# Patient Record
Sex: Male | Born: 1995 | Race: White | Hispanic: No | Marital: Single | Smoking: Never smoker
Health system: Southern US, Community
[De-identification: ages and names within clinical notes are randomized; demographics above are authoritative.]

## PROBLEM LIST (undated history)

## (undated) DIAGNOSIS — T7840XA Allergy, unspecified, initial encounter: Secondary | ICD-10-CM

## (undated) DIAGNOSIS — K5090 Crohn's disease, unspecified, without complications: Secondary | ICD-10-CM

## (undated) HISTORY — DX: Crohn's disease, unspecified, without complications: K50.90

## (undated) HISTORY — DX: Allergy, unspecified, initial encounter: T78.40XA

---
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
## 2014-12-27 ENCOUNTER — Ambulatory Visit (INDEPENDENT_AMBULATORY_CARE_PROVIDER_SITE_OTHER): Payer: Commercial Managed Care - PPO | Admitting: Medical

## 2014-12-27 ENCOUNTER — Encounter: Payer: Self-pay | Admitting: Medical

## 2014-12-27 VITALS — BP 133/67 | HR 88 | Temp 97.9°F | Ht 70.75 in | Wt 143.6 lb

## 2014-12-27 DIAGNOSIS — H7292 Unspecified perforation of tympanic membrane, left ear: Secondary | ICD-10-CM | POA: Diagnosis not present

## 2014-12-27 DIAGNOSIS — H7290 Unspecified perforation of tympanic membrane, unspecified ear: Secondary | ICD-10-CM | POA: Insufficient documentation

## 2014-12-27 DIAGNOSIS — K5010 Crohn's disease of large intestine without complications: Secondary | ICD-10-CM

## 2014-12-27 MED ORDER — AMOXICILLIN-POT CLAVULANATE 875-125 MG PO TABS: 1.0000 | ORAL_TABLET | Freq: Two times a day (BID) | ORAL | Status: DC

## 2014-12-27 NOTE — Assessment & Plan Note (Addendum)
Refer to Dr. Loreta Ave. Doing well. Refill his med for one mont until can see Gi. He will make sure he gave [place] correct dose then I will send in rx.

## 2014-12-27 NOTE — Progress Notes (Signed)
Subjective:    Patient ID: Bradley Adkins, male    DOB: February 17, 1996, 19 y.o.   MRN: [id number]  HPI   I have reviewed pt PMH, PSH, FH, Social History and Surgical History  Allergies- hx of and mild seasonal allergies.  Ear pain since 1 month. Friend slapped his ear. He had a lot of ringing and decreased hearing. Then gradual ringing decrease and hearing got better. When he worked out felt like when breaths heavily could feel air go through the membrane. Then the other swimming and mild deep submersion around 4 ft caused extreme pain.  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 Pt supposed to see Gi but has not seen recently. Dx in 2009.      Review of Systems  Constitutional: Negative for fever, chills, diaphoresis, activity change and fatigue.  HENT: Positive for ear pain.   Respiratory: Negative for cough, chest tightness and shortness of breath.   Cardiovascular: Negative for chest pain, palpitations and leg swelling.  Gastrointestinal: Negative for nausea, vomiting and abdominal pain.  Musculoskeletal: Negative for neck pain and neck stiffness.  Neurological: Negative for dizziness, tremors, seizures, syncope, facial asymmetry, speech difficulty, weakness, light-headedness, numbness and headaches.  Psychiatric/Behavioral: Negative for behavioral problems, confusion and agitation. The patient is not nervous/anxious.     Past Medical History  Diagnosis Date  . Crohn disease   . Allergy     History   Social History  . Marital Status: Single    Spouse Name: N/A  . Number of Children: N/A  . Years of Education: N/A   Occupational History  . Not on file.   Social History Main Topics  . Smoking status: Never Smoker   . Smokeless tobacco: Never Used  . Alcohol Use: No  . Drug Use: No  . Sexual Activity: Not on file   Other Topics Concern  . Not on file   Social History Narrative  . No narrative on file    No past surgical history on file.  No family history  on file.  Allergies  Allergen Reactions  . Shellfish Allergy Anaphylaxis    No current outpatient prescriptions on file prior to visit.   No current facility-administered medications on file prior to visit.    BP 133/67 mmHg  Pulse 88  Temp(Src) 97.9 F (36.6 C) (Oral)  Ht 5' 10.75" (1.797 m)  Wt 143 lb 9.6 oz (65.137 kg)  BMI 20.17 kg/m2  SpO2 100%       Objective:   Physical Exam   General  Mental Status - Alert. General Appearance - Well groomed. Not in acute distress.  Skin Rashes- No Rashes.  HEENT Head- Normal. Ear Auditory Canal - Left- Normal. Right - Normal.Tympanic Membrane- Left- red tm with small perforation in lower porl. Right- Normal. Eye Sclera/Conjunctiva- Left- Normal. Right- Normal. Nose & Sinuses Nasal Mucosa- Left-  Boggy and Congested. Right- not  Boggy and  Not Congested.Bilateral no  maxillary and no frontal sinus pressure. Mouth & Throat Lips: Upper Lip- Normal: no dryness, cracking, pallor, cyanosis, or vesicular eruption. Lower Lip-Normal: no dryness, cracking, pallor, cyanosis or vesicular eruption. Buccal Mucosa- Bilateral- No Aphthous ulcers. Oropharynx- No Discharge or Erythema. Tonsils: Characteristics- Bilateral- No Erythema or Congestion. Size/Enlargement- Bilateral- No enlargement. Discharge- bilateral-None.  Neck Neck- Supple. No Masses.   Chest and Lung Exam Auscultation: Breath Sounds:-Clear even and unlabored.  Cardiovascular Auscultation:Rythm- Regular, rate and rhythm. Murmurs & Other Heart Sounds:Ausculatation of the heart reveal- No Murmurs.  Lymphatic Head &  Neck General Head & Neck Lymphatics: Bilateral: Description- No Localized lymphadenopathy.      Assessment & Plan:

## 2014-12-27 NOTE — Patient Instructions (Addendum)
Perforated eardrum By exam perforated  and looks like it is healing. Otitis media as well. Refer to ent as well. Avoid trauma and no swimming.    Refer to Dr. Loreta Ave for crohns.  Follow up in 3 wks or as needed.

## 2014-12-27 NOTE — Assessment & Plan Note (Addendum)
By exam perforated  and looks like it is healing. Otitis media as well. Refer to ent as well. Avoid trauma and no swimming.

## 2015-02-27 ENCOUNTER — Ambulatory Visit (INDEPENDENT_AMBULATORY_CARE_PROVIDER_SITE_OTHER): Payer: Commercial Managed Care - PPO | Admitting: Medical

## 2015-02-27 ENCOUNTER — Encounter: Payer: Self-pay | Admitting: Medical

## 2015-02-27 VITALS — BP 125/55 | HR 67 | Temp 98.2°F | Ht 70.75 in | Wt 144.0 lb

## 2015-02-27 DIAGNOSIS — K5010 Crohn's disease of large intestine without complications: Secondary | ICD-10-CM

## 2015-02-27 MED ORDER — MESALAMINE 1.2 G PO TBEC: 1.2000 g | DELAYED_RELEASE_TABLET | Freq: Two times a day (BID) | ORAL | Status: DC

## 2015-02-27 NOTE — Assessment & Plan Note (Signed)
Stable symptomatically but not seen by GI for years. So will do some labs at pt request and ask him to do stool cards for blood and turn in. But also refer to Gi for evaluation. At least 5 years since saw pediatric Gi. No 19 yr old. Continue lialda.

## 2015-02-27 NOTE — Patient Instructions (Signed)
Crohn's colitis Stable symptomatically but not seen by GI for years. So will do some labs at pt request and ask him to do stool cards for blood and turn in. But also refer to Gi for evaluation. At least 5 years since saw pediatric Gi. No 19 yr old. Continue lialda.    Follow up in 2 months or as needed

## 2015-02-27 NOTE — Progress Notes (Signed)
   Subjective:    Patient ID: Bradley Adkins, male    DOB: 1996/03/04, 19 y.o.   MRN: [id number]  HPI  Pt is here for Crohns disease. He wants blood work. Pt has medication. He states he takes Lialda on average one time a day. Rather than twice a day. Pt denies any diarrhea. No fever, no chills or sweats. No significant abdominal pain that he can remember.  Pt states last time saw his specialist was a while ago. Chart indicate last saw GI in 2010.   Review of Systems  Constitutional: Negative for fever, chills, diaphoresis, activity change and fatigue.  Respiratory: Negative for cough, chest tightness and shortness of breath.   Cardiovascular: Negative for chest pain, palpitations and leg swelling.  Gastrointestinal: Negative for nausea, vomiting and abdominal pain.  Musculoskeletal: Negative for neck pain and neck stiffness.  Neurological: Negative for dizziness, syncope, weakness and numbness.  Psychiatric/Behavioral: Negative for behavioral problems, confusion and agitation. The patient is not nervous/anxious.      Past Medical History  Diagnosis Date  . Crohn disease   . Allergy     History   Social History  . Marital Status: Single    Spouse Name: N/A  . Number of Children: N/A  . Years of Education: N/A   Occupational History  . Not on file.   Social History Main Topics  . Smoking status: Never Smoker   . Smokeless tobacco: Never Used  . Alcohol Use: No  . Drug Use: No  . Sexual Activity: Not on file   Other Topics Concern  . Not on file   Social History Narrative    No past surgical history on file.  No family history on file.  Allergies  Allergen Reactions  . Shellfish Allergy Anaphylaxis    Current Outpatient Prescriptions on File Prior to Visit  Medication Sig Dispense Refill  . mesalamine (LIALDA) 1.2 G EC tablet Take 1.2 g by mouth 2 (two) times daily. Chrohns Disease     No current facility-administered medications on file prior to visit.     BP 125/55 mmHg  Pulse 67  Temp(Src) 98.2 F (36.8 C) (Oral)  Ht 5' 10.75" (1.797 m)  Wt 144 lb (65.318 kg)  BMI 20.23 kg/m2  SpO2 99%       Objective:   Physical Exam  General Appearance- Not in acute distress.  HEENT Eyes- Scleraeral/Conjuntiva-bilat- Not Yellow. Mouth & Throat- Normal.  Chest and Lung Exam Auscultation: Breath sounds:-Normal. Adventitious sounds:- No Adventitious sounds.  Cardiovascular Auscultation:Rythm - Regular. Heart Sounds -Normal heart sounds.  Abdomen Inspection:-Inspection Normal.  Palpation/Perucssion: Palpation and Percussion of the abdomen reveal- Non Tender, No Rebound tenderness, No rigidity(Guarding) and No Palpable abdominal masses.  Liver:-Normal.  Spleen:- Normal.         Assessment & Plan:

## 2015-02-28 LAB — CBC WITH DIFFERENTIAL/PLATELET
BASOS ABS: 0 10*3/uL (ref 0.0–0.1)
BASOS PCT: 0.5 % (ref 0.0–3.0)
EOS ABS: 0.2 10*3/uL (ref 0.0–0.7)
Eosinophils Relative: 2.5 % (ref 0.0–5.0)
HEMATOCRIT: 44.2 % (ref 39.0–52.0)
HEMOGLOBIN: 14.9 g/dL (ref 13.0–17.0)
LYMPHS ABS: 1.4 10*3/uL (ref 0.7–4.0)
Lymphocytes Relative: 21.4 % (ref 12.0–46.0)
MCHC: 33.9 g/dL (ref 30.0–36.0)
MCV: 88.8 fl (ref 78.0–100.0)
MONO ABS: 0.5 10*3/uL (ref 0.1–1.0)
Monocytes Relative: 7.7 % (ref 3.0–12.0)
NEUTROS ABS: 4.3 10*3/uL (ref 1.4–7.7)
Neutrophils Relative %: 67.9 % (ref 43.0–77.0)
PLATELETS: 221 10*3/uL (ref 150.0–400.0)
RBC: 4.97 Mil/uL (ref 4.22–5.81)
RDW: 12.9 % (ref 11.5–14.6)
WBC: 6.4 10*3/uL (ref 4.5–10.5)

## 2015-02-28 LAB — COMPREHENSIVE METABOLIC PANEL
ALK PHOS: 75 U/L (ref 39–117)
ALT: 17 U/L (ref 0–53)
AST: 25 U/L (ref 0–37)
Albumin: 4.5 g/dL (ref 3.5–5.2)
BUN: 13 mg/dL (ref 6–23)
CALCIUM: 9.5 mg/dL (ref 8.4–10.5)
CO2: 28 mEq/L (ref 19–32)
Chloride: 106 mEq/L (ref 96–112)
Creatinine, Ser: 0.92 mg/dL (ref 0.40–1.50)
GFR: 110.8 mL/min (ref >60.00)
Glucose, Bld: 90 mg/dL (ref 70–99)
POTASSIUM: 3.8 meq/L (ref 3.5–5.1)
SODIUM: 141 meq/L (ref 135–145)
Total Bilirubin: 0.6 mg/dL (ref 0.2–1.2)
Total Protein: 7.4 g/dL (ref 6.0–8.3)

## 2015-02-28 LAB — C-REACTIVE PROTEIN: CRP: 0.2 mg/dL — AB (ref 0.5–20.0)

## 2015-02-28 LAB — SEDIMENTATION RATE: Sed Rate: 5 mm/hr (ref 0–22)

## 2015-03-28 ENCOUNTER — Encounter: Payer: Self-pay | Admitting: Medical

## 2015-07-10 ENCOUNTER — Telehealth: Payer: Self-pay | Admitting: Medical

## 2015-07-10 NOTE — Telephone Encounter (Signed)
Left message for patient to call about Flu Shot °

## 2015-07-17 ENCOUNTER — Ambulatory Visit (INDEPENDENT_AMBULATORY_CARE_PROVIDER_SITE_OTHER): Payer: Commercial Managed Care - PPO | Admitting: Physician Assistant

## 2015-07-17 ENCOUNTER — Encounter: Payer: Self-pay | Admitting: Physician Assistant

## 2015-07-17 VITALS — BP 118/80 | HR 92 | Temp 98.4°F | Ht 70.75 in | Wt 142.0 lb

## 2015-07-17 DIAGNOSIS — Z202 Contact with and (suspected) exposure to infections with a predominantly sexual mode of transmission: Secondary | ICD-10-CM

## 2015-07-17 MED ORDER — AZITHROMYCIN 250 MG PO TABS
1000.0000 mg | ORAL_TABLET | Freq: Once | ORAL | Status: DC
Start: 2015-07-17 — End: 2015-12-26

## 2015-07-17 NOTE — Assessment & Plan Note (Signed)
Will empirically treat with Azithromycin. Will obtain full STI panel. Patient asymptomatic and exam unremarkable. Safe sex practices discussed. Will treat further based on results.

## 2015-07-17 NOTE — Progress Notes (Signed)
    Patient presents to clinic today c/o exposure to chlamydia and for routine STI screening. Endorses has been sexually active off and on with one male partner. Has not used protection. Notes he was informed today that she has chlamydia and is being treated. Patient denies penile/testicual pain or swelling. Denies discharge or genital lesion. 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  Past Medical History  Diagnosis Date  . Crohn disease (HCC)   . Allergy     Current Outpatient Prescriptions on File Prior to Visit  Medication Sig Dispense Refill  . mesalamine (LIALDA) 1.2 G EC tablet Take 1 tablet (1.2 g total) by mouth 2 (two) times daily. Chrohns Disease (Patient not taking: Reported on 07/17/2015) 60 tablet 3   No current facility-administered medications on file prior to visit.    Allergies  Allergen Reactions  . Shellfish Allergy Anaphylaxis    No family history on file.  Social History   Social History  . Marital Status: Single    Spouse Name: N/A  . Number of Children: N/A  . Years of Education: N/A   Social History Main Topics  . Smoking status: Never Smoker   . Smokeless tobacco: Never Used  . Alcohol Use: No  . Drug Use: No  . Sexual Activity: Not Asked   Other Topics Concern  . None   Social History Narrative   Review of Systems - See HPI.  All other ROS are negative.  BP 118/80 mmHg  Pulse 92  Temp(Src) 98.4 F (36.9 C) (Oral)  Ht 5' 10.75" (1.797 m)  Wt 142 lb (64.411 kg)  BMI 19.95 kg/m2  SpO2 99%  Physical Exam  Constitutional: He is oriented to person, place, and time and well-developed, well-nourished, and in no distress.  HENT:  Head: Normocephalic and atraumatic.  Eyes: Conjunctivae are normal.  Neck: Neck supple.  Cardiovascular: Normal rate, regular rhythm, normal heart sounds and intact distal pulses.   Pulmonary/Chest: Effort normal and breath sounds normal.  Genitourinary: Testes/scrotum normal and penis normal. No  discharge found.  Neurological: He is alert and oriented to person, place, and time.  Skin: Skin is warm and dry. No rash noted.  Psychiatric: Affect normal.  Vitals reviewed.   No results found for this or any previous visit (from the past 2160 hour(s)).  Assessment/Plan: Exposure to chlamydia Will empirically treat with Azithromycin. Will obtain full STI panel. Patient asymptomatic and exam unremarkable. Safe sex practices discussed. Will treat further based on results.

## 2015-07-17 NOTE — Patient Instructions (Signed)
Please take the antibiotic as directed. Please avoid sexual contact until workup and treatment are complete. Always use condoms to prevent spread of infection. Limit sexual partners.  I will call you with your results.

## 2015-07-17 NOTE — Progress Notes (Signed)
Pre visit review using our clinic review tool, if applicable. No additional management support is needed unless otherwise documented below in the visit note. 

## 2015-07-18 LAB — GC/CHLAMYDIA PROBE AMP, URINE
CHLAMYDIA, SWAB/URINE, PCR: NOT DETECTED
GC PROBE AMP, URINE: NOT DETECTED

## 2015-07-18 LAB — RPR

## 2015-07-18 LAB — HIV ANTIBODY (ROUTINE TESTING W REFLEX): HIV 1&2 Ab, 4th Generation: NONREACTIVE

## 2015-07-19 ENCOUNTER — Telehealth: Payer: Self-pay | Admitting: Medical

## 2015-07-19 LAB — HSV(HERPES SMPLX)ABS-I+II(IGG+IGM)-BLD
HSV 1 GLYCOPROTEIN G AB, IGG: 9.45 IV — AB
Herpes Simplex Vrs I&II-IgM Ab (EIA): 0.46 INDEX

## 2015-07-19 MED ORDER — OSELTAMIVIR PHOSPHATE 75 MG PO CAPS: 75.0000 mg | ORAL_CAPSULE | Freq: Every day | ORAL | Status: DC

## 2015-07-19 NOTE — Telephone Encounter (Signed)
Pt mom called about her husband who had + flu at his doctor. Pt son has no symptoms. So I will rx  tamiflu prophylaxis. Also advised mom if Cin has any questions to call our office so I discuss this with him since his mom called on his behalf. I did not give any info on patient during conversation. Just took recent hx mom gave.

## 2015-07-19 NOTE — Telephone Encounter (Signed)
Pt mother called bc pt father was diagnosed with the flu yesterday at [facility]. She is wanting to be prescribed Tamiflu. Her ph# [phone]  [facility]   CC Robin bc pt mother & brother are having same issue

## 2015-07-19 NOTE — Telephone Encounter (Signed)
Edward please advise if pt will need to be seen.

## 2015-12-10 ENCOUNTER — Telehealth: Payer: Self-pay | Admitting: *Deleted

## 2015-12-10 DIAGNOSIS — K5010 Crohn's disease of large intestine without complications: Secondary | ICD-10-CM

## 2015-12-10 NOTE — Telephone Encounter (Signed)
Referral to Dr. Loreta Ave. Need done asap. Has appontment on Thursday

## 2015-12-10 NOTE — Telephone Encounter (Signed)
Bradley Adkins-- I spoke with pt's mom, she states that pt has history of Crohn's and will be going out of the country in June. Pt has scheduled an appt to see Dr Loreta Ave this Thursday and they are requesting referral from PCP.  Please advise?    Cammy Copa, CMA at 12/10/2015 2:08 PM     Status: Signed       Expand All Collapse All   Tried to return call from pt's mother. No answer. Left vm for her to give [place] a call back.            Pearline Cables, MD at 12/10/2015 1:50 PM     Status: Signed       Expand All Collapse All   [name]- I did place order for referral but did not know what issue they are concerned about. Could you call and find out? thanks            Maia Petties at 12/10/2015 11:53 AM     Status: Signed       Expand All Collapse All   Caller name: Bradley Adkins  Relationship to patient: mom Can be reached: [phone]   Reason for call: Pt was to be referred to GI after 10/24/15 visit with Dr. Patsy Lager. Mom called [facility], Dr. Charna Elizabeth at GI and was told they can fit him in this Thursday if we send referral in. Their phone# [phone].

## 2015-12-26 ENCOUNTER — Ambulatory Visit (INDEPENDENT_AMBULATORY_CARE_PROVIDER_SITE_OTHER): Payer: Commercial Managed Care - PPO | Admitting: Medical

## 2015-12-26 ENCOUNTER — Encounter: Payer: Self-pay | Admitting: Medical

## 2015-12-26 VITALS — BP 114/76 | HR 89 | Temp 98.1°F | Resp 16 | Ht 70.75 in | Wt 140.0 lb

## 2015-12-26 DIAGNOSIS — Z7189 Other specified counseling: Secondary | ICD-10-CM | POA: Diagnosis not present

## 2015-12-26 DIAGNOSIS — Z23 Encounter for immunization: Secondary | ICD-10-CM

## 2015-12-26 DIAGNOSIS — Z7185 Encounter for immunization safety counseling: Secondary | ICD-10-CM

## 2015-12-26 MED ORDER — TYPHOID VACCINE PO CPDR: 1.0000 | DELAYED_RELEASE_CAPSULE | ORAL | Status: DC

## 2015-12-26 NOTE — Progress Notes (Signed)
Pre visit review using our clinic review tool, if applicable. No additional management support is needed unless otherwise documented below in the visit note. 

## 2015-12-26 NOTE — Addendum Note (Signed)
Addended by: Neldon Labella on: 12/26/2015 10:06 AM   Modules accepted: Orders

## 2015-12-26 NOTE — Progress Notes (Signed)
   Subjective:    Patient ID: Bradley Adkins, male    DOB: June 27, 1996, 20 y.o.   MRN: [id number]  HPI  Pt is going to [place] for a couple of weeks then will go to [place], [place] and [place]. Pt will be gone for about a month.   Pt trip is June 23 rd. This is when he leaves for [place]. The June 28 or 29 will travel to other countries. Pt will be in other countries for about 3 weeks.   Pt is going to trip with his best friend.    Review of Systems  Constitutional: Negative for fever, chills and fatigue.  Respiratory: Negative for cough, choking, shortness of breath and wheezing.   Cardiovascular: Negative for chest pain and palpitations.  Gastrointestinal: Negative for abdominal pain and anal bleeding.  Musculoskeletal: Negative for myalgias, back pain and gait problem.  Skin: Negative for rash.  Neurological: Negative for dizziness and headaches.  Hematological: Negative for adenopathy. Does not bruise/bleed easily.  Psychiatric/Behavioral: Negative for behavioral problems, confusion and dysphoric mood. The patient is not nervous/anxious.     Past Medical History  Diagnosis Date  . Crohn disease (HCC)   . Allergy      Social History   Social History  . Marital Status: Single    Spouse Name: N/A  . Number of Children: N/A  . Years of Education: N/A   Occupational History  . Not on file.   Social History Main Topics  . Smoking status: Never Smoker   . Smokeless tobacco: Never Used  . Alcohol Use: No  . Drug Use: No  . Sexual Activity: Not on file   Other Topics Concern  . Not on file   Social History Narrative    No past surgical history on file.  No family history on file.  Allergies  Allergen Reactions  . Shellfish Allergy Anaphylaxis    No current outpatient prescriptions on file prior to visit.   No current facility-administered medications on file prior to visit.    BP 114/76 mmHg  Pulse 89  Temp(Src) 98.1 F (36.7 C) (Oral)  Resp  16  Ht 5' 10.75" (1.797 m)  Wt 140 lb (63.504 kg)  BMI 19.67 kg/m2  SpO2 99%       Objective:   Physical Exam  General- No acute distress. Pleasant patient. Lungs- Clear, even and unlabored. Heart- regular rate and rhythm. Neurologic- CNII- XII grossly intact.         Assessment & Plan:  For your upcoming travel will give hepatitis A vaccine.(1st in series), up to date to date on  tetanus, and rx vaccine for typhoid.   We checked out vaccine records on state data base today.  Drink only bottle water. Avoid food from street venders, and wear OFF or other type product to minimize mosquitoe bites.   Follow up as needed before or after travel.  Pt encouraged to call Health/travel clinic. Ask about japanese encephalitis vaccine since you have concern. Also ask about various countries you will travel to. See if they recommend malarone.

## 2015-12-26 NOTE — Patient Instructions (Addendum)
For your upcoming travel will give hepatitis A vaccine.(1st in series), up to date to date on  tetanus, and rx vaccine for typhoid.   We checked out vaccine records on state data base today.  Drink only bottle water. Avoid food from street venders, and wear OFF or other type product to minimize mosquitoe bites.   Pt encouraged to call Health/travel clinic. Ask about japanese encephalitis vaccine since you have concern. Also ask about various countries you will travel to. See if they recommend malarone.   Follow up as needed before or after travel.

## 2015-12-31 ENCOUNTER — Other Ambulatory Visit: Payer: Self-pay | Admitting: Medical

## 2015-12-31 MED ORDER — DOXYCYCLINE HYCLATE 100 MG PO TABS: 100.0000 mg | ORAL_TABLET | Freq: Every day | ORAL | Status: DC

## 2015-12-31 NOTE — Telephone Encounter (Signed)
For prophylaxis with doxycycline for malaria. Should take 1 tab a day 1-2 days before travel to malarial prone area then for 4 weeks after return for malarial area. So I will give him 28 tabs with 1 refill.  I believe he is traveling to [place] then leaving for [employer]. Recommend starting doxy day or 2 before leaving [place]. Remind him not to take on empty stomach(eat something before taking) and advise sometimes sun sensitivity reaction with doxy. So imagine [place] pretty sunny. In addition he has chron's. Advise pt that I ask he talk with his GI doctor to get opinion of possible chron flare while on doxycycline. Document you advised about contacting his GI doctor.

## 2015-12-31 NOTE — Telephone Encounter (Signed)
Attempted to reach pt to see how many the travel clinic advises taking of the doxycycline. Please advise on medication if 20 is sufficient.

## 2015-12-31 NOTE — Telephone Encounter (Signed)
°  Relationship to patient: Self Can be reached: [phone]   Pharmacy:   [id number] - [place], [place] - [address] [phone] (Phone) [phone] (Fax)        Reason for call: States that Travel Clinic informed him to take the Doxycyciline 100mg s. Request Rx be sent to pharmacy.

## 2016-01-01 NOTE — Telephone Encounter (Signed)
Left message for pt that Rx was ready at the pharmacy and the instructions how to take the medication before and after travel per providers request. Pt was asked to call back with any questions.

## 2016-01-02 ENCOUNTER — Other Ambulatory Visit: Payer: Self-pay | Admitting: Gastroenterology

## 2016-01-02 DIAGNOSIS — K5090 Crohn's disease, unspecified, without complications: Secondary | ICD-10-CM

## 2016-01-03 NOTE — Telephone Encounter (Signed)
Letter mailed to pt advising of the note below.

## 2016-01-10 ENCOUNTER — Telehealth: Payer: Self-pay | Admitting: Medical

## 2016-01-10 NOTE — Telephone Encounter (Signed)
Called patient with help of C. Daphine Deutscher, [place]  Order clarified with patient.

## 2016-01-10 NOTE — Telephone Encounter (Signed)
Pt has questions about how to take the doxycycline that was prescribed for him. He is requesting call back at [phone]

## 2016-03-03 ENCOUNTER — Other Ambulatory Visit: Payer: Commercial Managed Care - PPO

## 2016-03-03 ENCOUNTER — Ambulatory Visit
Admission: RE | Admit: 2016-03-03 | Discharge: 2016-03-03 | Disposition: A | Discharge status: Home / Self Care | Payer: Commercial Managed Care - PPO | Source: Ambulatory Visit | Attending: Gastroenterology | Admitting: Gastroenterology

## 2016-03-03 DIAGNOSIS — K5090 Crohn's disease, unspecified, without complications: Secondary | ICD-10-CM

## 2016-03-03 MED ORDER — IOPAMIDOL (ISOVUE-300) INJECTION 61%
100.0000 mL | Freq: Once | INTRAVENOUS | Status: AC | PRN
  Administered 2016-03-03: 100 mL via INTRAVENOUS

## 2017-08-04 ENCOUNTER — Ambulatory Visit: Payer: Commercial Managed Care - PPO | Admitting: Medical

## 2017-08-04 DIAGNOSIS — Z0289 Encounter for other administrative examinations: Secondary | ICD-10-CM

## 2017-08-05 ENCOUNTER — Telehealth: Payer: Self-pay | Admitting: Medical

## 2017-08-05 ENCOUNTER — Ambulatory Visit (INDEPENDENT_AMBULATORY_CARE_PROVIDER_SITE_OTHER): Payer: Commercial Managed Care - PPO | Admitting: Medical

## 2017-08-05 ENCOUNTER — Ambulatory Visit (HOSPITAL_BASED_OUTPATIENT_CLINIC_OR_DEPARTMENT_OTHER)
Admission: RE | Admit: 2017-08-05 | Discharge: 2017-08-05 | Disposition: A | Discharge status: Home / Self Care | Payer: Commercial Managed Care - PPO | Source: Ambulatory Visit | Attending: Medical | Admitting: Medical

## 2017-08-05 ENCOUNTER — Encounter: Payer: Self-pay | Admitting: Medical

## 2017-08-05 VITALS — BP 132/78 | HR 75 | Temp 98.1°F | Resp 16 | Ht 70.0 in | Wt 151.0 lb

## 2017-08-05 DIAGNOSIS — S62306A Unspecified fracture of fifth metacarpal bone, right hand, initial encounter for closed fracture: Secondary | ICD-10-CM | POA: Diagnosis not present

## 2017-08-05 DIAGNOSIS — S6291XA Unspecified fracture of right wrist and hand, initial encounter for closed fracture: Secondary | ICD-10-CM

## 2017-08-05 DIAGNOSIS — Y9372 Activity, wrestling: Secondary | ICD-10-CM | POA: Insufficient documentation

## 2017-08-05 DIAGNOSIS — M79641 Pain in right hand: Secondary | ICD-10-CM

## 2017-08-05 DIAGNOSIS — X58XXXA Exposure to other specified factors, initial encounter: Secondary | ICD-10-CM | POA: Insufficient documentation

## 2017-08-05 NOTE — Telephone Encounter (Signed)
Open to review.  

## 2017-08-05 NOTE — Progress Notes (Signed)
Subjective:    Patient ID: Bradley Adkins, male    DOB: 07/15/1996, 22 y.o.   MRN: [id number]  HPI  Pt in for rt hand pain. 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 Pt was drunk when he wrestled. Not really sure what happened. He felt little pain that night when falling asleep. But next morning still had pain. Since then hand hurts. He points to 5th metarcapal area. Hurts to flex and extend.   This happened Tuesday around 10-11 pm.  Pt is rt handed.   Review of Systems  Constitutional: Negative for chills, fatigue and fever.  Respiratory: Negative for cough, chest tightness, shortness of breath and wheezing.   Cardiovascular: Negative for chest pain and palpitations.  Gastrointestinal: Negative for abdominal pain, blood in stool, diarrhea, nausea and vomiting.  Musculoskeletal: Negative for back pain.  Skin: Negative for rash.  Hematological: Negative for adenopathy. Does not bruise/bleed easily.  Psychiatric/Behavioral: Negative for behavioral problems, confusion, decreased concentration and suicidal ideas. The patient is not nervous/anxious.     Past Medical History:  Diagnosis Date  . Allergy   . Crohn disease (HCC)      Social History   Socioeconomic History  . Marital status: Single    Spouse name: Not on file  . Number of children: Not on file  . Years of education: Not on file  . Highest education level: Not on file  Social Needs  . Financial resource strain: Not on file  . Food insecurity - worry: Not on file  . Food insecurity - inability: Not on file  . Transportation needs - medical: Not on file  . Transportation needs - non-medical: Not on file  Occupational History  . Not on file  Tobacco Use  . Smoking status: Never Smoker  . Smokeless tobacco: Never Used  Substance and Sexual Activity  . Alcohol use: No    Alcohol/week: 0.0 oz  . Drug use: No  . Sexual activity: Not on file  Other Topics Concern  . Not on file  Social History Narrative  . Not on  file     No family history on file.  Allergies  Allergen Reactions  . Shellfish Allergy Anaphylaxis    No current outpatient medications on file prior to visit.   No current facility-administered medications on file prior to visit.     BP 132/78   Pulse 75   Temp 98.1 F (36.7 C) (Oral)   Resp 16   Ht 5\' 10"  (1.778 m)   Wt 151 lb (68.5 kg)   SpO2 100%   BMI 21.67 kg/m       Objective:   Physical Exam  General- NAD. Rt hand- 4th and 5th metacarpal pain. Some moderate swelling. Pain on flexion and extension.       Assessment & Plan:  For your right hand pain, I put in x-ray order stat.  You can get that done now and will know within a couple hours if you have a fracture.  After reviewing x-ray will decide if you need referral to sports medicine.  Is already Thursday but I would try to get you in possibly by tomorrow.  You are leaving to go back to college this upcoming weekend.  So you may need to follow-up in 1 week with provider [place].  When I reviewed the x-ray results I will have a better idea.  Note I did explain to him the possibility of initial x-ray not showing small fracture but if  clinically still having pain/swelling etc. then repeat x-ray would be indicated and sometimes may show fracture later.  Also explained briefly even if both the anterior other soft tissue sometimes can be.

## 2017-08-05 NOTE — Patient Instructions (Addendum)
For your right hand pain, I put in x-ray order stat.  You can get that done now and will know within a couple hours if you have a fracture.  After reviewing x-ray will decide if you need referral to sports medicine.  Is already Thursday but I would try to get you in possibly by tomorrow.  You are leaving to go back to college this upcoming weekend.  So you may need to follow-up in 1 [place].  When I reviewed the x-ray results I will have a better idea.  Would recommend ibuprofen for mild to moderate pain.  I later got the x-ray results back and it did show fracture.  I am currently trying to get him in with sports medicine either later today or tomorrow morning.  I will have staff notify him of the fracture and of the appointment.

## 2017-08-06 ENCOUNTER — Ambulatory Visit: Payer: Commercial Managed Care - PPO | Admitting: Family Medicine

## 2017-08-06 ENCOUNTER — Ambulatory Visit: Payer: Self-pay

## 2017-08-06 ENCOUNTER — Ambulatory Visit (INDEPENDENT_AMBULATORY_CARE_PROVIDER_SITE_OTHER): Payer: Commercial Managed Care - PPO | Admitting: Family Medicine

## 2017-08-06 ENCOUNTER — Encounter: Payer: Self-pay | Admitting: Family Medicine

## 2017-08-06 VITALS — BP 104/80 | HR 67 | Ht 71.0 in | Wt 153.0 lb

## 2017-08-06 DIAGNOSIS — S62316A Displaced fracture of base of fifth metacarpal bone, right hand, initial encounter for closed fracture: Secondary | ICD-10-CM

## 2017-08-06 DIAGNOSIS — M79641 Pain in right hand: Secondary | ICD-10-CM | POA: Diagnosis not present

## 2017-08-06 MED ORDER — VITAMIN D (ERGOCALCIFEROL) 1.25 MG (50000 UNIT) PO CAPS: 50000.0000 [IU] | ORAL_CAPSULE | ORAL | 0 refills | Status: AC

## 2017-08-06 NOTE — Patient Instructions (Signed)
Good to see you  Have a mild displace fracture  Dr. Sherrie Sport, MD  [facility] [phone] [email or web address]  Otherwise come back in 3 weeks  Wear the brace as much as possible

## 2017-08-06 NOTE — Assessment & Plan Note (Signed)
Patient does have 1/5 metacarpal fracture with mild displacement.  Little bit dorsal.  Put in an ulnar gutter splint at 45-60 degrees of flexion.  Discussed proper wear.  Because of patient's Crohn's disease and the lack of any type of callus formation 72 hours after injury patient will start on vitamin D supplementation to aid in healing.  Patient will be going back to college and encourage patient to follow-up there otherwise follow-up with me in 3 weeks when we will repeat x-ray and ultrasound.

## 2017-08-06 NOTE — Progress Notes (Signed)
[name] [address] [place], [place] [address] Phone: [phone] Subjective:     CC: Right hand pain  BJY:[id number]  Tymothy Adkins is a 22 y.o. male coming in with complaint of right hand pain. He was wrestling with a friend.  Onset- Tuesday Location- Pinky side Duration-  Character- Discomfort  Aggravating factors- Reliving factors- Ice, hemp oil  Therapies tried- wrapped Severity-6 out of 10   Patient did have x-rays.  Found to have a mild displaced metacarpal fracture of the fifth with very minimal angulation.  Mostly extra-articular potential small amount of joint involvement  Past Medical History:  Diagnosis Date  . Allergy   . Crohn disease (HCC)    No past surgical history on file. Social History   Socioeconomic History  . Marital status: Single    Spouse name: None  . Number of children: None  . Years of education: None  . Highest education level: None  Social Needs  . Financial resource strain: None  . Food insecurity - worry: None  . Food insecurity - inability: None  . Transportation needs - medical: None  . Transportation needs - non-medical: None  Occupational History  . None  Tobacco Use  . Smoking status: Never Smoker  . Smokeless tobacco: Never Used  Substance and Sexual Activity  . Alcohol use: No    Alcohol/week: 0.0 oz  . Drug use: No  . Sexual activity: None  Other Topics Concern  . None  Social History Narrative  . None   Allergies  Allergen Reactions  . Shellfish Allergy Anaphylaxis   No family history on file.  No family history of autoimmune but patient does have Crohn's disease   Past medical history, social, surgical and family history all reviewed in electronic medical record.  No pertanent information unless stated regarding to the chief complaint.   Review of Systems:Review of systems updated and as accurate as of 08/06/17  No headache, visual changes, nausea, vomiting, diarrhea,  constipation, dizziness, abdominal pain, skin rash, fevers, chills, night sweats, weight loss, swollen lymph nodes, body aches, joint swelling, chest pain, shortness of breath, mood changes.  Positive muscle aches  Objective  Blood pressure 104/80, pulse 67, height 5\' 11"  (1.803 m), weight 153 lb (69.4 kg), SpO2 98 %. Systems examined below as of 08/06/17   General: No apparent distress alert and oriented x3 mood and affect normal, dressed appropriately.  HEENT: Pupils equal, extraocular movements intact  Respiratory: Patient's speak in full sentences and does not appear short of breath  Cardiovascular: No lower extremity edema, non tender, no erythema  Skin: Warm dry intact with no signs of infection or rash on extremities or on axial skeleton.  Abdomen: Soft nontender  Neuro: Cranial nerves II through XII are intact, neurovascularly intact in all extremities with 2+ DTRs and 2+ pulses.  Lymph: No lymphadenopathy of posterior or anterior cervical chain or axillae bilaterally.  Gait normal with good balance and coordination.  MSK:  Non tender with full range of motion and good stability and symmetric strength and tone of shoulders, elbows, wrist, hip, knee and ankles bilaterally.   Hand exam on right show swelling. Mild bruising. Mild crepitus, pain on the dorsal aspect Mild angulation. Able to make a fist, nvi.   Limited muscular skeletal ultrasound was performed and interpreted by Judi Saa  Limited ultrasound of patient's fifth metacarpal shows the patient does have a displaced fracture noted.  Does go proximally to the MCP  and does seem to go into the joint by approximately 10%.  Minimal angulation noted.  No callus formation noted. Impression: Fifth metacarpal fracture with mild displacement   Impression and Recommendations:     This case required medical decision making of moderate complexity.      Note: This dictation was prepared with Dragon dictation along with smaller  phrase technology. Any transcriptional errors that result from this process are unintentional.

## 2017-08-12 ENCOUNTER — Telehealth: Payer: Self-pay | Admitting: Family Medicine

## 2017-08-12 ENCOUNTER — Encounter: Payer: Self-pay | Admitting: Family Medicine

## 2017-08-12 NOTE — Telephone Encounter (Signed)
Ok to workout only in the cast  I would pat dry the hand form time to time out of the cast or could put on a glove.  If it is breaking down the skin then too tight

## 2017-08-12 NOTE — Telephone Encounter (Signed)
Discussed with pt

## 2017-08-12 NOTE — Telephone Encounter (Signed)
Copied from CRM [id number]. Topic: Quick Communication - See Telephone Encounter >> Aug 12, 2017 11:32 AM Rudi Coco, NT wrote: CRM for notification. See Telephone encounter for:   08/12/17. Pt calling misplaced referral order for [place] Dr. [place] that was given to him from Dr. Katrinka Blazing When he takes off cast the pinky finger is (white and damp) does he need padding wanting to know if its normal (will send pic. On my chart) Can he Workout? Is so how often. Pt can be reached at [phone]

## 2017-09-21 ENCOUNTER — Telehealth: Payer: Self-pay | Admitting: Family Medicine

## 2017-09-21 DIAGNOSIS — S62316A Displaced fracture of base of fifth metacarpal bone, right hand, initial encounter for closed fracture: Secondary | ICD-10-CM

## 2017-09-21 NOTE — Telephone Encounter (Signed)
X-ray ordered & faxed.

## 2017-09-21 NOTE — Telephone Encounter (Signed)
Copied from CRM [id number]. Topic: Referral - Request >> Sep 21, 2017 12:37 PM Oneal Grout wrote: Reason for CRM: Requesting a referral for follow up [facility], fax# [phone]

## 2017-10-05 ENCOUNTER — Telehealth: Payer: Self-pay | Admitting: Family Medicine

## 2017-10-05 NOTE — Telephone Encounter (Signed)
Copied from CRM [id number]. Topic: Quick Communication - See Telephone Encounter >> Oct 05, 2017  4:18 PM Arlyss Gandy, NT wrote: CRM for notification. See Telephone encounter for: Pt wanted to let Dr. Katrinka Blazing know that he had the disc of his follow-up xray of his hand sent to the office today.  10/05/17.

## 2017-10-06 NOTE — Telephone Encounter (Signed)
Noted  

## 2018-03-31 IMAGING — DX DG HAND COMPLETE 3+V*R*
3 series · 3 of 3 positions shown · non-contrast
Comparison: None.

CLINICAL DATA: Pain after wrestling

EXAM:
RIGHT HAND - COMPLETE 3+ VIEW

[hand pa]
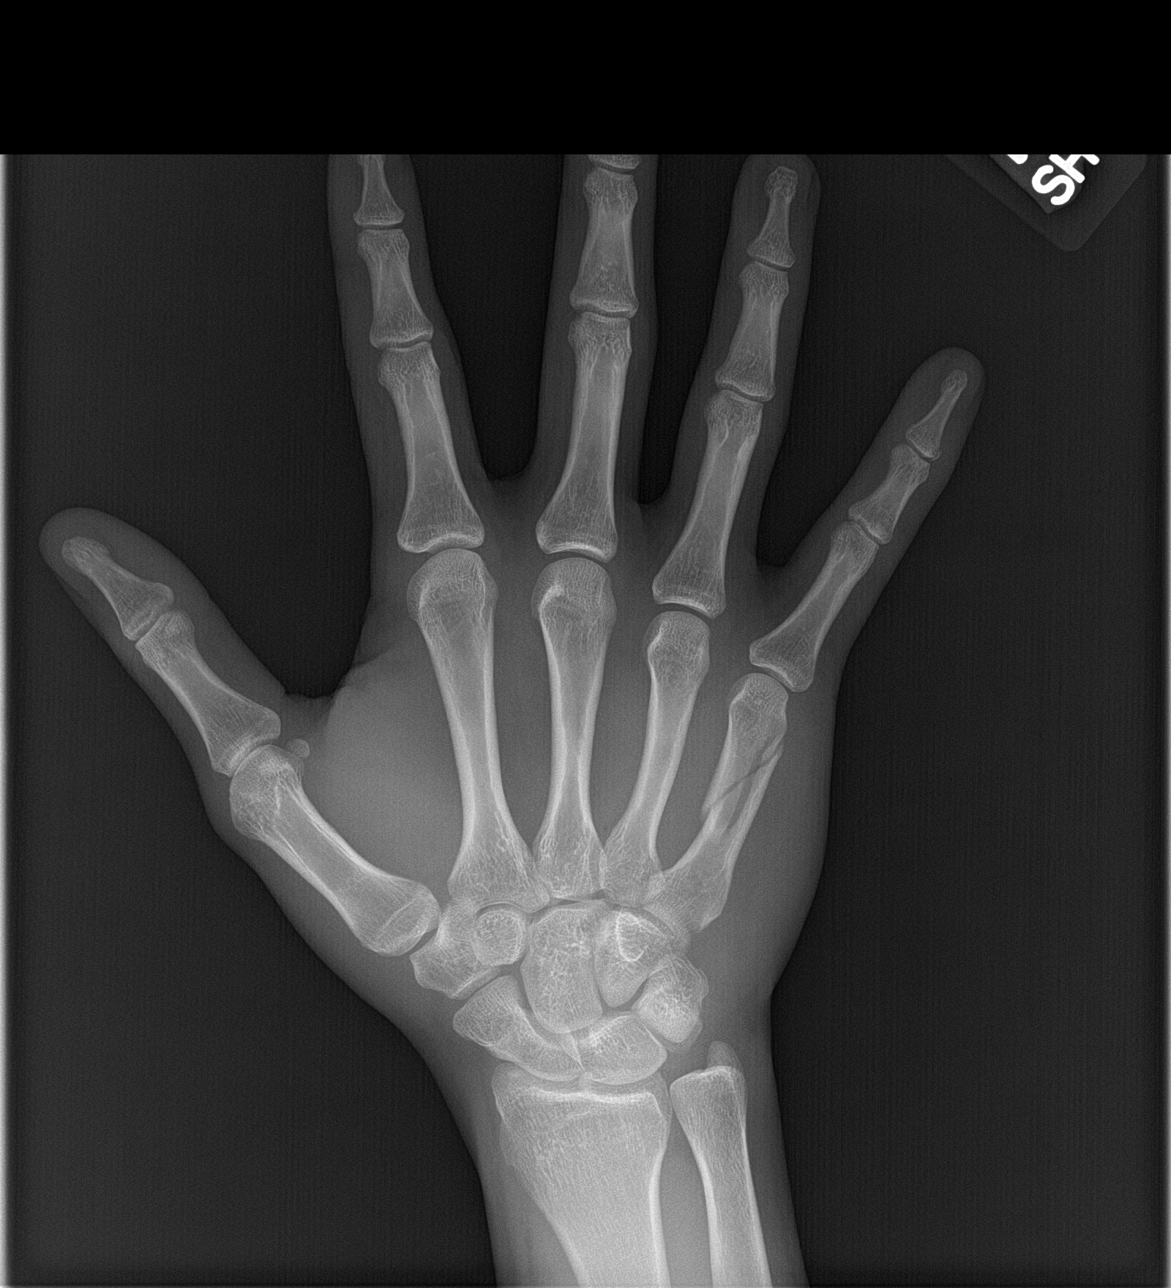

[hand obl]
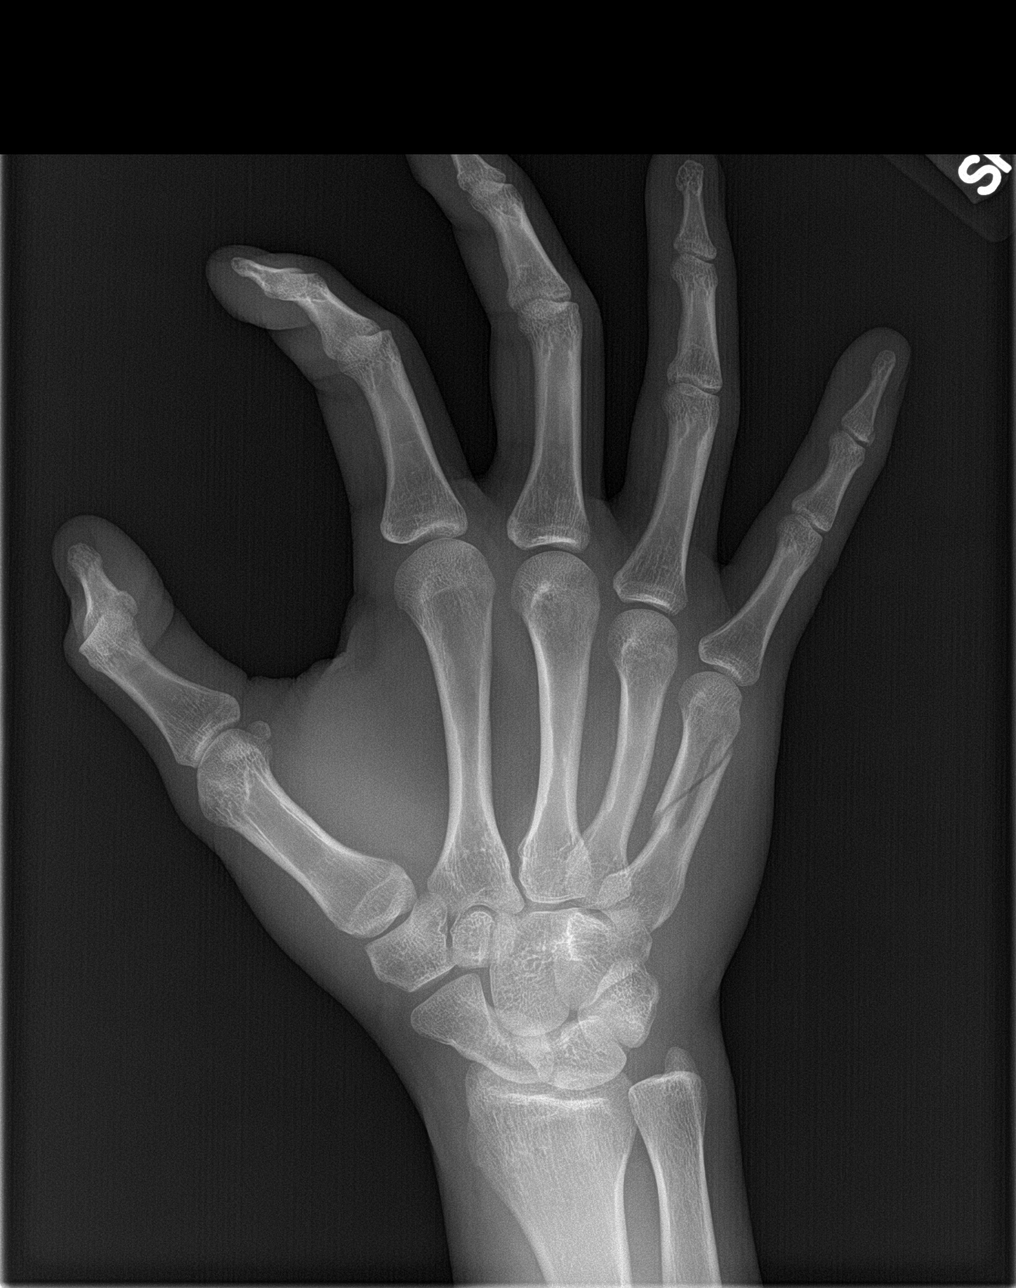

[hand lat]
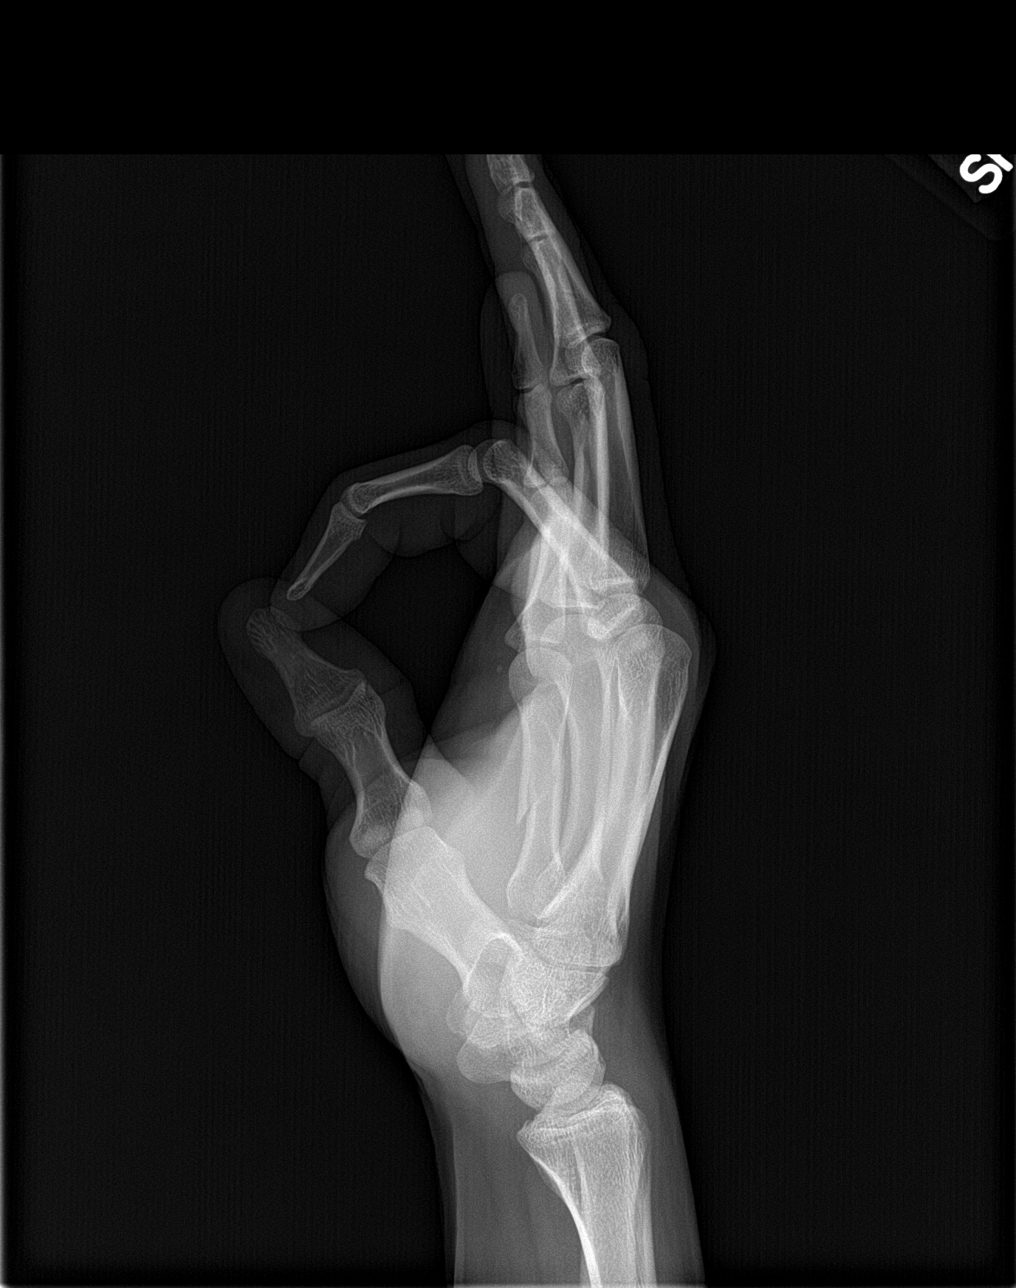

[3 of 3 positions shown; findings below may reference images not displayed]

FINDINGS: Frontal, oblique, and lateral views obtained. There is a comminuted
fracture through the mid the distal aspects of the fifth metacarpal
with mild volar and lateral displacement of the distal fracture
fragment with respect proximal fragment. No other fractures are
evident. No dislocation. Joint spaces appear normal. No erosive
change.
IMPRESSION: Comminuted fracture fifth metacarpal with mild displacement of
fracture fragments. No other fractures. No dislocation. No evident
arthropathy.

These results will be called to the ordering clinician or
representative by the Radiologist Assistant, and communication
documented in the PACS or zVision Dashboard.

## 2018-10-24 ENCOUNTER — Encounter: Payer: Self-pay | Admitting: Medical

## 2018-10-24 ENCOUNTER — Telehealth: Payer: Self-pay | Admitting: Medical

## 2018-10-24 ENCOUNTER — Other Ambulatory Visit: Payer: Self-pay

## 2018-10-24 ENCOUNTER — Ambulatory Visit (INDEPENDENT_AMBULATORY_CARE_PROVIDER_SITE_OTHER): Payer: Commercial Managed Care - PPO | Admitting: Medical

## 2018-10-24 DIAGNOSIS — M79641 Pain in right hand: Secondary | ICD-10-CM

## 2018-10-24 NOTE — Telephone Encounter (Signed)
Spoke to Bradley Adkins. Offered him an appt tomorrow with Dr. Katrinka Blazing. Bradley Adkins stated he decided he would like to give it another week & would call back if needed.

## 2018-10-24 NOTE — Telephone Encounter (Signed)
Copied from CRM [id number]. Topic: Appointment Scheduling - Prior Auth Required for Appointment >> Oct 24, 2018  3:02 PM Jens Som A wrote: CRM for notification. See Telephone encounter for: 10/24/18.  Patient is calling to schedule an appointment with Dr. Katrinka Blazing regarding a potential broken hand. Please advise.  He wants to ask weather to come in and get an ultrasound, xray, or wear the brace that Dr. Katrinka Blazing gave him previously for a previous broken hand in the same spot.  [phone] (M)

## 2018-10-24 NOTE — Telephone Encounter (Signed)
Copied from CRM [id number]. Topic: Quick Communication - See Telephone Encounter >> Oct 24, 2018 12:07 PM Jens Som A wrote: CRM for notification. See Telephone encounter for: 10/24/18.  Patient is calling because he thinks he broke his hand. Does Dr. Alvira Monday want him to get an x-ray or ultrasound or x-ray first?  Or come in first. Please advise CB- [phone]  Thanks

## 2018-10-24 NOTE — Progress Notes (Signed)
   Subjective:    Patient ID: Bradley Adkins, male    DOB: September 07, 1995, 23 y.o.   MRN: [id number]  HPI  Virtual Visit via Video Note  I connected with Bradley Adkins on 10/24/18 at  1:40 PM EDT by a video enabled telemedicine application and verified that I am speaking with the correct person using two identifiers.   I discussed the limitations of evaluation and management by telemedicine and the availability of in person appointments. The patient expressed understanding and agreed to proceed.   History of Present Illness: No vitals taken.  Pt has pain  in rt hand. He punched the floor after video game. He injured the hand last Wednesday. He points to upper portion of 4th metacarpal. Pain was severe and swollen initially. After 2 days pain was less and swelling was less. He states still some pain intermittent with certain movements.    Observations/Objective: Rt hand pain- mild swelling over rt 4th metacarpal. Pt shows me this on video.  Assessment and Plan:     I discussed the assessment and treatment plan with the patient. The patient was provided an opportunity to ask questions and all were answered. The patient agreed with the plan and demonstrated an understanding of the instructions.   The patient was advised to call back or seek an in-person evaluation if the symptoms worsen or if the condition fails to improve as anticipated.  25 minute appointment spent with pt. 50% of time spent explaining why I think he needs xray. Explained benefit of getting xray vs his concern about  covid and getting xray. Explained called down stairs and no on in xray presently. He still hesitated.   Esperanza Richters, PA-C        Review of Systems     Objective:   Physical Exam   NA.     Assessment & Plan:  Plan will be to get xray to see if pt has fracture. Counseled him by his report of severe swelling and pain first 2 days that xray is indicated. Explained limited number of people in  building recently due to virus concerns. But pt hesitant to be evaluated. He wants to call former sports medicine MD to see if he could just use his former splint that was used when he had 5th metacarpal fracture in past.  He requested number to his former sport med MD. I will also try to send Dr Katrinka Blazing a message explaining that pt may call him  I asked pt to give me update as I may need to but other hand xray order in if he gets it tomorrow. He is not in town presently.

## 2018-10-24 NOTE — Telephone Encounter (Signed)
I saw mutual pt. You treated boxer fracture rt hand 5th metacarpal fracture in the past. Recent new injury. I recommended he get xray. He wanted your number and may call your office. He wants your opinion.  I explained benefit of xray. He is concerned about exposure to covid. If he gets xray and has fracture I could send him your way. Or he wants to know if he could use his former splint.  I sent you copy of his note. I just wanted to give you heads up and some context if you speake with him.

## 2018-10-24 NOTE — Telephone Encounter (Signed)
Already handled see. My note. Call pt back tomorrow morning and see if he decided to go through with xray of hand or note. If so will need to put in new order.

## 2018-10-24 NOTE — Patient Instructions (Addendum)
Plan will be to get xray to see if pt has fracture. Counseled him by his report of severe swelling and pain first 2 days that xray is indicated. Explained limited number of people in building recently due to virus concerns. But pt hesitant to be evaluated. He wants to call former sports medicine MD to see if he could just use his former splint that was used when he had 5th metacarpal fracture in past.  He requested number to his former sport med MD. I will also try to send Dr Katrinka Blazing a message explaining that pt may call him  I asked pt to give me update as I may need to but other hand xray order in if he gets it tomorrow. He is not in town presently.  Esperanza Richters, PA-C

## 2018-10-28 ENCOUNTER — Ambulatory Visit: Payer: Commercial Managed Care - PPO | Admitting: Medical

## 2018-12-11 ENCOUNTER — Other Ambulatory Visit: Payer: Self-pay

## 2018-12-11 ENCOUNTER — Emergency Department (HOSPITAL_COMMUNITY)
Admission: EM | Admit: 2018-12-11 | Discharge: 2018-12-11 | Disposition: A | Discharge status: Home / Self Care | Payer: Commercial Managed Care - PPO | Attending: Emergency Medicine | Admitting: Emergency Medicine

## 2018-12-11 ENCOUNTER — Encounter (HOSPITAL_COMMUNITY): Payer: Self-pay

## 2018-12-11 ENCOUNTER — Emergency Department (HOSPITAL_COMMUNITY): Payer: Commercial Managed Care - PPO

## 2018-12-11 DIAGNOSIS — Z79899 Other long term (current) drug therapy: Secondary | ICD-10-CM | POA: Diagnosis not present

## 2018-12-11 DIAGNOSIS — R0602 Shortness of breath: Secondary | ICD-10-CM | POA: Diagnosis present

## 2018-12-11 DIAGNOSIS — Z20828 Contact with and (suspected) exposure to other viral communicable diseases: Secondary | ICD-10-CM | POA: Insufficient documentation

## 2018-12-11 LAB — SARS CORONAVIRUS 2 BY RT PCR (HOSPITAL ORDER, PERFORMED IN ~~LOC~~ HOSPITAL LAB): SARS Coronavirus 2: NEGATIVE

## 2018-12-11 NOTE — ED Notes (Signed)
ED Provider at bedside. 

## 2018-12-11 NOTE — Discharge Instructions (Addendum)
As discussed, your evaluation today has been largely reassuring.  But, it is important that you monitor your condition carefully, and do not hesitate to return to the ED if you develop new, or concerning changes in your condition. ? ?Otherwise, please follow-up with your physician for appropriate ongoing care. ? ?

## 2018-12-11 NOTE — ED Notes (Signed)
Bed: WA03 Expected date:  Expected time:  Means of arrival:  Comments: Covid?

## 2018-12-11 NOTE — ED Triage Notes (Signed)
Pt reports being exposed to COVID. Pt has had labored breathing x 2 days. Pt denies cough, fever.

## 2018-12-11 NOTE — ED Provider Notes (Signed)
[facility]-EMERGENCY DEPT Provider Note   CSN: [id number] Arrival date & time: 12/11/18  1742    History   Chief Complaint Chief Complaint  Patient presents with  . Shortness of Breath    HPI Bradley Adkins is a 23 y.o. male.     HPI Patient presents with dyspnea. Patient also has chest tightness. Onset was yesterday, and since that time he has had persistency of symptoms fairly patient is healthy, young, notes that he runs, typically has no respiratory issues. He has positive COVID exposure, with 2 parents both in healthcare. No fever, no vomiting, no other notable complaints.  On since onset, symptoms are worse with activity, but not clearly better with anything. Past Medical History:  Diagnosis Date  . Allergy   . Crohn disease [facility])     Patient Active Problem List   Diagnosis Date Noted  . Displaced fracture of base of fifth metacarpal bone, right hand, initial encounter for closed fracture 08/06/2017  . Exposure to chlamydia 07/17/2015  . Perforated eardrum 12/27/2014  . Crohn's colitis (HCC) 12/27/2014    History reviewed. No pertinent surgical history.      Home Medications    Prior to Admission medications   Medication Sig Start Date End Date Taking? Authorizing Provider  Vitamin D, Ergocalciferol, (DRISDOL) 50000 units CAPS capsule Take 1 capsule (50,000 Units total) by mouth every 7 (seven) days. Patient not taking: Reported on 12/11/2018 08/06/17   Judi Saa, DO    Family History History reviewed. No pertinent family history.  Social History Social History   Tobacco Use  . Smoking status: Never Smoker  . Smokeless tobacco: Never Used  Substance Use Topics  . Alcohol use: No    Alcohol/week: 0.0 standard drinks  . Drug use: No     Allergies   Shellfish allergy   Review of Systems Review of Systems  Constitutional:       Per HPI, otherwise negative  HENT:       Per HPI, otherwise negative  Respiratory:        Per HPI, otherwise negative  Cardiovascular:       Per HPI, otherwise negative  Gastrointestinal: Negative for vomiting.  Endocrine:       Negative aside from HPI  Genitourinary:       Neg aside from HPI   Musculoskeletal:       Per HPI, otherwise negative  Skin: Negative.   Neurological: Negative for syncope.     Physical Exam Updated Vital Signs BP 130/84   Pulse 78   Temp 97.9 F (36.6 C) (Oral)   Resp 18   SpO2 99%   Physical Exam Vitals signs and nursing note reviewed.  Constitutional:      General: He is not in acute distress.    Appearance: He is well-developed.  HENT:     Head: Normocephalic and atraumatic.  Eyes:     Conjunctiva/sclera: Conjunctivae normal.  Cardiovascular:     Rate and Rhythm: Normal rate and regular rhythm.  Pulmonary:     Effort: Pulmonary effort is normal. No respiratory distress.     Breath sounds: No stridor.  Abdominal:     General: There is no distension.  Skin:    General: Skin is warm and dry.  Neurological:     Mental Status: He is alert and oriented to person, place, and time.      ED Treatments / Results  Labs (all labs ordered are listed, but only abnormal  results are displayed) Labs Reviewed  SARS CORONAVIRUS 2 (HOSPITAL ORDER, PERFORMED IN [facility] LAB)    EKG None  Radiology Dg Chest Port 1 View  Result Date: 12/11/2018 CLINICAL DATA:  Labored breathing for 2 days. Covid exposure. EXAM: PORTABLE CHEST 1 VIEW COMPARISON:  None. FINDINGS: The heart size and mediastinal contours are within normal limits. Both lungs are clear. The visualized skeletal structures are unremarkable. IMPRESSION: No active disease. Electronically Signed   By: Signa Kell M.D.   On: 12/11/2018 18:43    Procedures Procedures (including critical care time)  Medications Ordered in ED Medications - No data to display   Initial Impression / Assessment and Plan / ED Course  I have reviewed the triage vital signs and  the nursing notes.  Pertinent labs & imaging results that were available during my care of the patient were reviewed by me and considered in my medical decision making (see chart for details).        8:00 PM On repeat exam patient is awake, alert, sitting upright. Vital signs unremarkable, no hypoxia, no increased work of breathing. With reassuring x-ray, negative Chowbey test, no hemodynamic stability, no evidence for pneumonia, no evidence for bacteremia, sepsis or other acute new pathology. Symptoms may be secondary to mild bronchitis versus allergies, versus other viral infection. Patient discharged with return precautions, follow-up instructions.  Final Clinical Impressions(s) / ED Diagnoses   Final diagnoses:  Shortness of breath     Gerhard Munch, MD 12/11/18 2001

## 2019-08-06 IMAGING — DX PORTABLE CHEST - 1 VIEW
1 series · 1 of 1 positions shown · non-contrast
Comparison: None.

CLINICAL DATA: Labored breathing for 2 days. Covid exposure.

EXAM:
PORTABLE CHEST 1 VIEW

[chest ap]
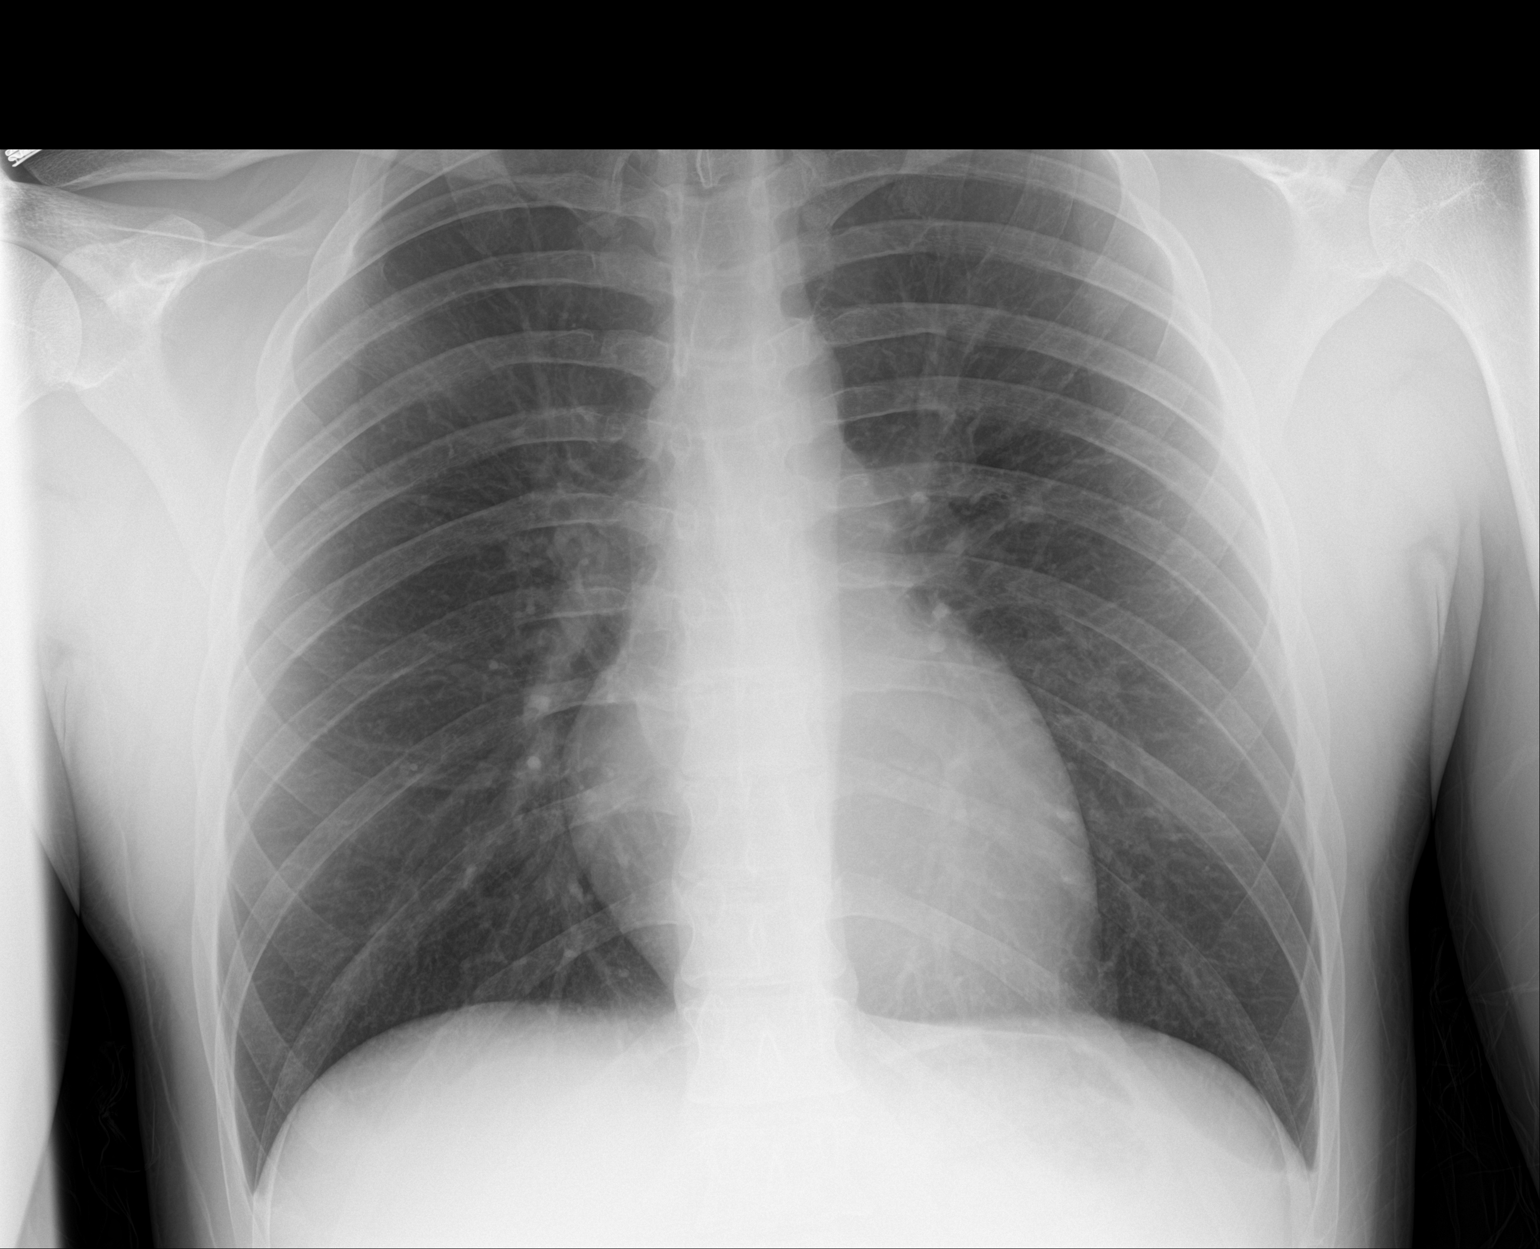

[1 of 1 positions shown; findings below may reference images not displayed]

FINDINGS: The heart size and mediastinal contours are within normal limits.
Both lungs are clear. The visualized skeletal structures are
unremarkable.
IMPRESSION: No active disease.
